# Patient Record
Sex: Female | Born: 1955 | Race: White | Hispanic: No | Marital: Married | Smoking: Current every day smoker
Health system: Southern US, Community
[De-identification: ages and names within clinical notes are randomized; demographics above are authoritative.]

## PROBLEM LIST (undated history)

## (undated) DIAGNOSIS — E785 Hyperlipidemia, unspecified: Secondary | ICD-10-CM

## (undated) DIAGNOSIS — E079 Disorder of thyroid, unspecified: Secondary | ICD-10-CM

## (undated) DIAGNOSIS — C55 Malignant neoplasm of uterus, part unspecified: Secondary | ICD-10-CM

## (undated) DIAGNOSIS — I10 Essential (primary) hypertension: Secondary | ICD-10-CM

## (undated) HISTORY — PX: ABDOMINAL HYSTERECTOMY: SHX81

## (undated) HISTORY — DX: Disorder of thyroid, unspecified: E07.9

## (undated) HISTORY — DX: Hyperlipidemia, unspecified: E78.5

## (undated) HISTORY — PX: BREAST BIOPSY: SHX20

## (undated) HISTORY — DX: Essential (primary) hypertension: I10

---

## 2005-01-13 ENCOUNTER — Ambulatory Visit: Payer: Self-pay | Admitting: Internal Medicine

## 2006-11-03 ENCOUNTER — Ambulatory Visit: Payer: Self-pay | Admitting: Internal Medicine

## 2007-11-27 DIAGNOSIS — C55 Malignant neoplasm of uterus, part unspecified: Secondary | ICD-10-CM

## 2007-11-27 HISTORY — DX: Malignant neoplasm of uterus, part unspecified: C55

## 2007-12-27 ENCOUNTER — Ambulatory Visit: Payer: Self-pay | Admitting: Gynecologic Oncology

## 2007-12-28 ENCOUNTER — Ambulatory Visit: Payer: Self-pay | Admitting: Obstetrics and Gynecology

## 2008-01-04 ENCOUNTER — Ambulatory Visit: Payer: Self-pay | Admitting: Obstetrics and Gynecology

## 2008-01-10 ENCOUNTER — Inpatient Hospital Stay: Payer: Self-pay | Admitting: Obstetrics and Gynecology

## 2008-02-14 ENCOUNTER — Ambulatory Visit: Payer: Self-pay | Admitting: Gynecologic Oncology

## 2008-04-11 ENCOUNTER — Ambulatory Visit: Payer: Self-pay | Admitting: Internal Medicine

## 2011-03-05 ENCOUNTER — Ambulatory Visit: Payer: Self-pay | Admitting: Internal Medicine

## 2012-03-08 ENCOUNTER — Ambulatory Visit: Payer: Self-pay | Admitting: Internal Medicine

## 2012-03-17 ENCOUNTER — Ambulatory Visit: Payer: Self-pay | Admitting: Internal Medicine

## 2012-04-26 HISTORY — PX: BREAST CYST ASPIRATION: SHX578

## 2012-10-11 ENCOUNTER — Encounter: Payer: Self-pay | Admitting: *Deleted

## 2012-10-27 ENCOUNTER — Other Ambulatory Visit: Payer: Federal, State, Local not specified - PPO

## 2012-10-27 ENCOUNTER — Encounter: Payer: Self-pay | Admitting: General Surgery

## 2012-10-27 ENCOUNTER — Ambulatory Visit (INDEPENDENT_AMBULATORY_CARE_PROVIDER_SITE_OTHER): Payer: Federal, State, Local not specified - PPO | Admitting: General Surgery

## 2012-10-27 VITALS — BP 124/68 | HR 74 | Resp 14 | Ht 61.0 in | Wt 119.0 lb

## 2012-10-27 DIAGNOSIS — N6001 Solitary cyst of right breast: Secondary | ICD-10-CM

## 2012-10-27 DIAGNOSIS — N63 Unspecified lump in unspecified breast: Secondary | ICD-10-CM

## 2012-10-27 DIAGNOSIS — N6009 Solitary cyst of unspecified breast: Secondary | ICD-10-CM

## 2012-10-27 NOTE — Patient Instructions (Signed)
Patient to return in February 2015 from bilateral screening mammogram.

## 2012-10-27 NOTE — Progress Notes (Addendum)
Patient ID: Yolanda Bailey, female   DOB: [birth year], 57 y.o.   MRN: [id number]  Chief Complaint  Patient presents with  . Other    mammogram    HPI Yolanda Bailey is a 57 y.o. female who presents for a breast evaluation. The most recent mammogram was done on 03/17/12. Patient does perform regular self breast checks and gets regular mammograms done.    HPI  Past Medical History  Diagnosis Date  . Hyperlipidemia   . Hypertension   . Thyroid disorder     Past Surgical History  Procedure Laterality Date  . Abdominal hysterectomy    . Breast biopsy Left     No family history on file.  Social History History  Substance Use Topics  . Smoking status: Current Every Day Smoker -- 1.00 packs/day for 40 years  . Smokeless tobacco: Never Used  . Alcohol Use: Yes    Allergies  Allergen Reactions  . Synthroid [Levothyroxine]     Liver levels    Current Outpatient Prescriptions  Medication Sig Dispense Refill  . aspirin 81 MG tablet Take 81 mg by mouth daily.      . calcium carbonate (OS-CAL) 600 MG TABS tablet Take 600 mg by mouth 2 (two) times daily with a meal.      . chlorhexidine (PERIDEX) 0.12 % solution 1 mL.      . hydrochlorothiazide (HYDRODIURIL) 25 MG tablet Take 25 mg by mouth daily.      Marland Kitchen levothyroxine (SYNTHROID, LEVOTHROID) 100 MCG tablet Take 100 mcg by mouth daily before breakfast.      . simvastatin (ZOCOR) 20 MG tablet Take 20 mg by mouth every evening.      . Vitamin D, Ergocalciferol, (DRISDOL) 50000 UNITS CAPS capsule Take 50,000 Units by mouth.       No current facility-administered medications for this visit.    Review of Systems Review of Systems  Constitutional: Negative.   Respiratory: Negative.   Cardiovascular: Negative.     Blood pressure 124/68, pulse 74, resp. rate 14, height 5\' 1"  (1.549 m), weight 119 lb (53.978 kg).  Physical Exam Physical Exam  Constitutional: She is oriented to person, place, and time. She appears well-developed and  well-nourished.  Cardiovascular: Normal rate, regular rhythm and normal heart sounds.   Pulmonary/Chest: Breath sounds normal. Right breast exhibits no inverted nipple, no mass, no nipple discharge, no skin change and no tenderness. Left breast exhibits no inverted nipple, no mass, no nipple discharge, no skin change and no tenderness.  Abdominal: Bowel sounds are normal.  Lymphadenopathy:    She has no cervical adenopathy.    She has no axillary adenopathy.  Neurological: She is alert and oriented to person, place, and time.  Skin: Skin is warm and dry.    Data Reviewed March 17, 2012 mammograms that showed an indeterminate nodule of the right breast. BI-RAD-4. Ultrasound examination dated March 28, 2012 and showed a well-defined 0.3 x 0.54 x 0.64 cm nodule in the right breast one centimeter from the nipple. Aspiration was completed with near complete resolution. Cytology was benign.  Ultrasound examination of the right breast in the 6:00 position again shows a 0.36 x 0.64 x 0.65 cm anechoic lesion with posterior acoustic enhancement. It is difficult to ascertain whether there is a thick wall or whether prominent breast parenchyma superior to this lesion. The patient was reluctantly  amenable to repeat aspiration. This was completed using 1 cc of 1% plain Xylocaine. No discomfort during puncture of the  skin but tenderness in passage through the thickened tissue superior to the cyst. Aspiration showed complete resolution. This fluid was discarded.  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 Previous benign cytology.     Plan    We'll arrange for a followup exam with repeat screening mammograms in 6 months with office ultrasound follow.        Yolanda Bailey 11/28/2012, 12:19 PM

## 2012-10-27 NOTE — Addendum Note (Signed)
Addended by: [name] on: 10/27/2012 09:50 PM   Modules accepted: Orders

## 2012-11-09 ENCOUNTER — Encounter: Payer: Self-pay | Admitting: General Surgery

## 2012-11-23 ENCOUNTER — Emergency Department: Payer: Self-pay | Admitting: Internal Medicine

## 2012-11-23 LAB — CBC
HCT: 42.8 % (ref 35.0–47.0)
HGB: 14.8 g/dL (ref 12.0–16.0)
MCV: 90 fL (ref 80–100)
Platelet: 280 10*3/uL (ref 150–440)
RDW: 13.2 % (ref 11.5–14.5)
WBC: 6.2 10*3/uL (ref 3.6–11.0)

## 2012-11-23 LAB — BASIC METABOLIC PANEL
Anion Gap: 6 — ABNORMAL LOW (ref 7–16)
Chloride: 101 mmol/L (ref 98–107)
Co2: 26 mmol/L (ref 21–32)
Creatinine: 0.8 mg/dL (ref 0.60–1.30)
EGFR (Non-African Amer.): >60
Potassium: 3.5 mmol/L (ref 3.5–5.1)

## 2012-11-23 LAB — TROPONIN I: Troponin-I: <0.02 ng/mL

## 2012-12-02 ENCOUNTER — Ambulatory Visit: Payer: Self-pay | Admitting: Internal Medicine

## 2013-03-16 ENCOUNTER — Ambulatory Visit: Payer: Self-pay | Admitting: General Surgery

## 2013-03-16 ENCOUNTER — Encounter: Payer: Self-pay | Admitting: General Surgery

## 2013-03-22 ENCOUNTER — Telehealth: Payer: Self-pay

## 2013-03-22 NOTE — Telephone Encounter (Signed)
Message left for patient to call back to reschedule her appointment for tomorrow due to the weather.

## 2013-03-22 NOTE — Telephone Encounter (Signed)
Patient rescheduled for 04/11/13 at 1:30 pm.

## 2013-03-23 ENCOUNTER — Ambulatory Visit: Payer: Federal, State, Local not specified - PPO | Admitting: General Surgery

## 2013-04-11 ENCOUNTER — Ambulatory Visit (INDEPENDENT_AMBULATORY_CARE_PROVIDER_SITE_OTHER): Payer: Federal, State, Local not specified - PPO | Admitting: General Surgery

## 2013-04-11 ENCOUNTER — Encounter: Payer: Self-pay | Admitting: General Surgery

## 2013-04-11 VITALS — BP 102/56 | HR 70 | Resp 12 | Ht 61.0 in | Wt 123.0 lb

## 2013-04-11 DIAGNOSIS — N6009 Solitary cyst of unspecified breast: Secondary | ICD-10-CM

## 2013-04-11 NOTE — Patient Instructions (Signed)
Continue self breast exams. Call office for any new breast issues or concerns. 

## 2013-04-11 NOTE — Progress Notes (Signed)
Patient ID: Yolanda Bailey, female   DOB: 04/18/1955, 58 y.o.   MRN: [id number]  Chief Complaint  Patient presents with  . Follow-up    follow up bilateral screening mammogram     HPI Yolanda Bailey is a 58 y.o. female who presents for a breast evaluation. The most recent mammogram was done on 03/16/13. Patient does perform regular self breast checks and gets regular mammograms done. The patient denies any problems with the breasts at this time.    HPI  Past Medical History  Diagnosis Date  . Hyperlipidemia   . Hypertension   . Thyroid disorder     Past Surgical History  Procedure Laterality Date  . Abdominal hysterectomy    . Breast biopsy Left     History reviewed. No pertinent family history.  Social History History  Substance Use Topics  . Smoking status: Current Every Day Smoker -- 1.00 packs/day for 40 years  . Smokeless tobacco: Never Used  . Alcohol Use: Yes    Allergies  Allergen Reactions  . Synthroid [Levothyroxine]     Liver levels    Current Outpatient Prescriptions  Medication Sig Dispense Refill  . ALPRAZolam (XANAX) 0.25 MG tablet       . aspirin 81 MG tablet Take 81 mg by mouth daily.      . calcium carbonate (OS-CAL) 600 MG TABS tablet Take 600 mg by mouth 2 (two) times daily with a meal.      . chlorhexidine (PERIDEX) 0.12 % solution 1 mL.      . hydrochlorothiazide (HYDRODIURIL) 25 MG tablet Take 25 mg by mouth daily.      Marland Kitchen levothyroxine (SYNTHROID) 100 MCG tablet Take 100 mcg by mouth daily before breakfast.      . sertraline (ZOLOFT) 50 MG tablet       . simvastatin (ZOCOR) 20 MG tablet Take 20 mg by mouth every evening.      . Vitamin D, Ergocalciferol, (DRISDOL) 50000 UNITS CAPS capsule Take 50,000 Units by mouth.       No current facility-administered medications for this visit.    Review of Systems Review of Systems  Constitutional: Negative.   Respiratory: Negative.   Cardiovascular: Negative.     Blood pressure 102/56, pulse 70,  resp. rate 12, height 5\' 1"  (1.549 m), weight 123 lb (55.792 kg).  Physical Exam Physical Exam  Constitutional: She is oriented to person, place, and time. She appears well-developed and well-nourished.  Neck: Neck supple.  Cardiovascular: Normal rate, regular rhythm and normal heart sounds.   Pulmonary/Chest: Effort normal and breath sounds normal. Right breast exhibits no inverted nipple, no mass, no nipple discharge, no skin change and no tenderness. Left breast exhibits no inverted nipple, no mass, no nipple discharge, no skin change and no tenderness.  Lymphadenopathy:    She has no cervical adenopathy.    She has no axillary adenopathy.  Neurological: She is alert and oriented to person, place, and time.  Skin: Skin is warm and dry.    Data Reviewed Bilateral mammograms dated March 16, 2013 were reviewed and compared to previous studies. Density located in the 6 o'clock position of the right breast is no longer evident.  Radiologist recommendation follow up in one year. BI-RAD-1.  Assessment    Benign breast exam.     Plan    No evidence of recurrent mammographic evidence of cyst recurrence. No indication for repeat ultrasound on today's exam.  The patient will resume annual screening mammograms in the  care of Dr. Doy Hutching in spring 2016.        Yolanda Bailey 04/11/2013, 9:51 PM

## 2013-06-16 ENCOUNTER — Ambulatory Visit: Payer: Self-pay | Admitting: Gastroenterology

## 2013-11-27 ENCOUNTER — Encounter: Payer: Self-pay | Admitting: General Surgery

## 2014-03-28 IMAGING — US ULTRASOUND RIGHT BREAST
1 series · 14 of 25 positions shown · non-contrast
Comparison: none

REASON FOR EXAM: av rt asymmetric density
COMMENTS:

PROCEDURE:     US  - US BREAST RIGHT  - March 17, 2012 [DATE]
RESULT:

[Series 1: ultrasound right breast · 0.08mm/px · 14 of 28 slices shown]
[im 1/28]
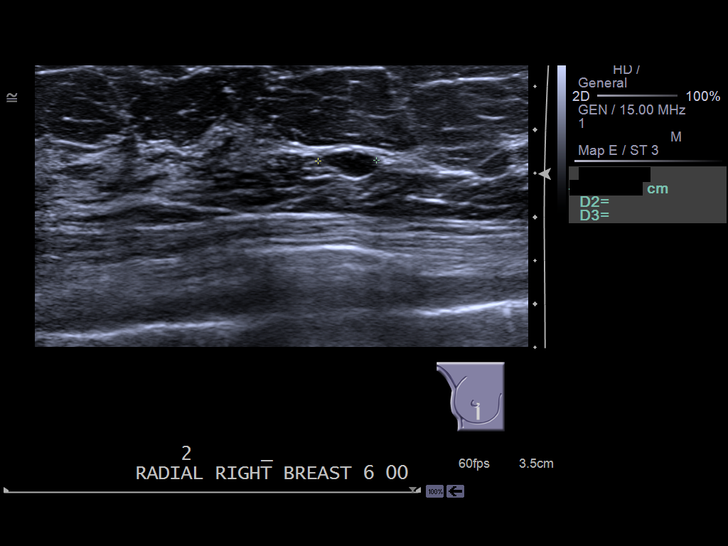
[im 3/28]
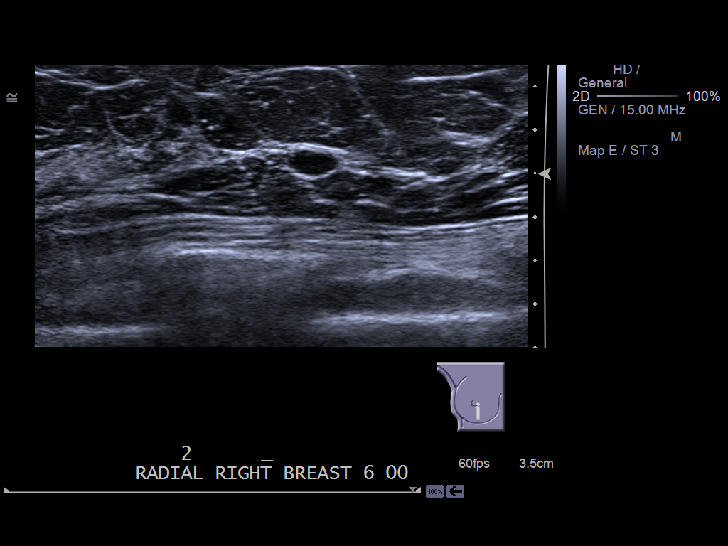
[im 5/28]
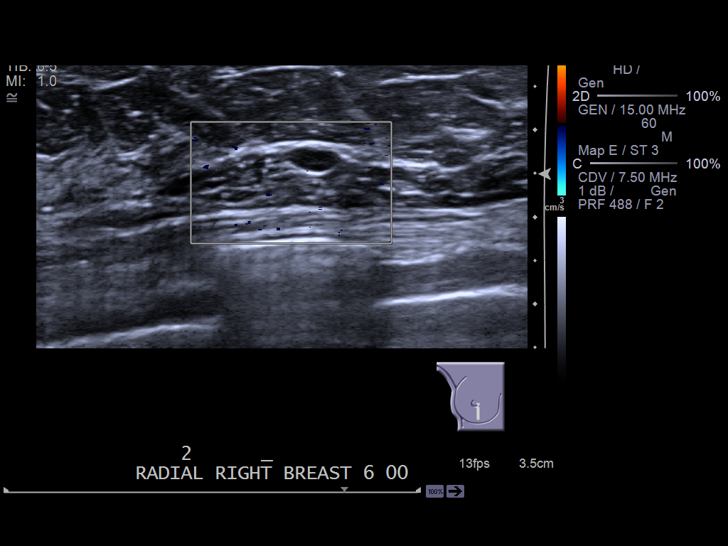
[im 7/28]
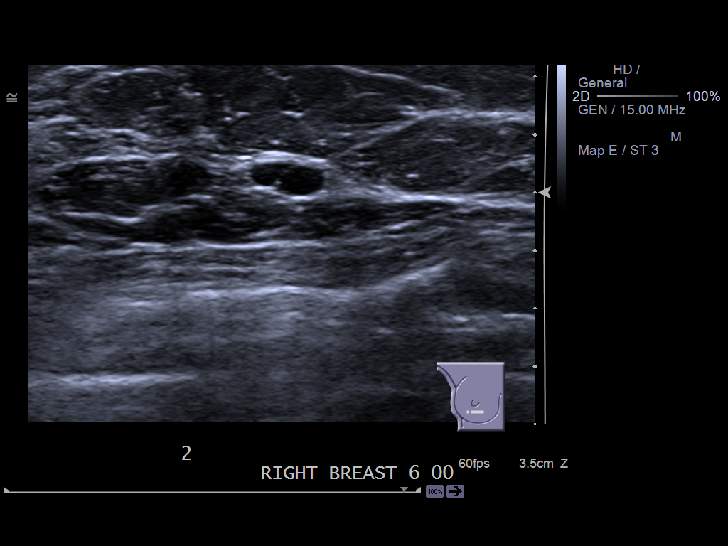
[im 10/28]
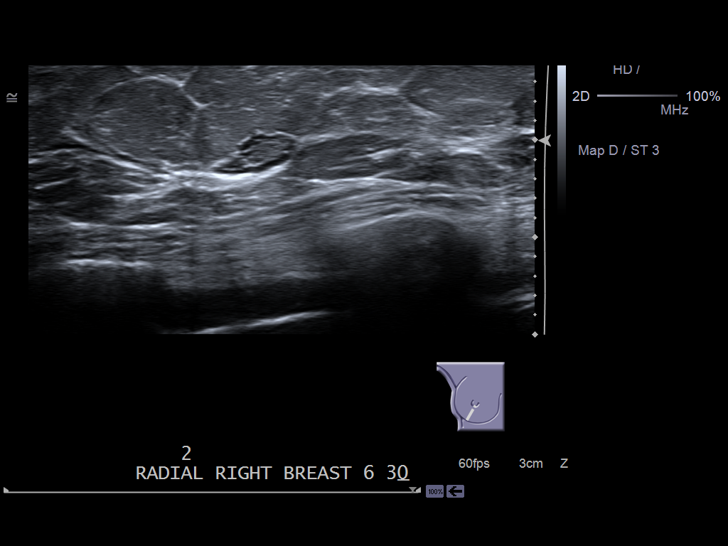
[im 11/28]
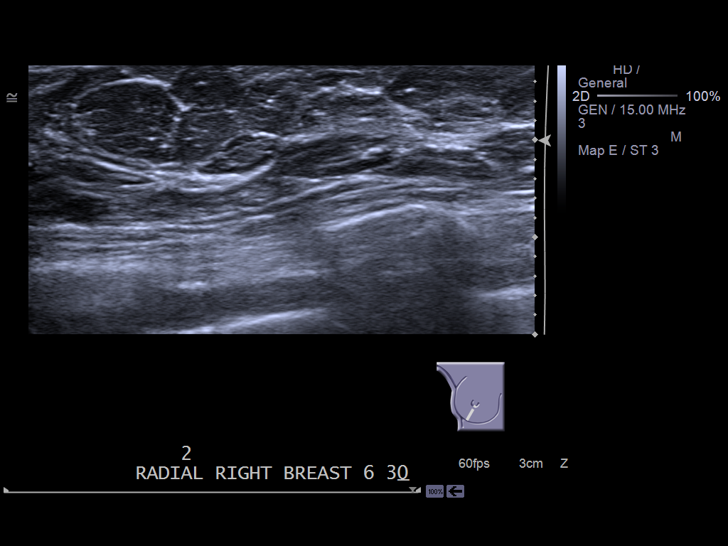
[im 13/28]
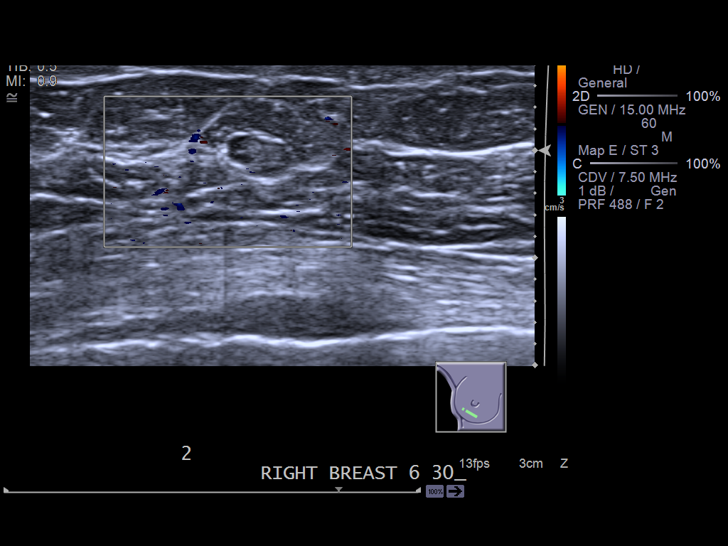
[im 15/28]
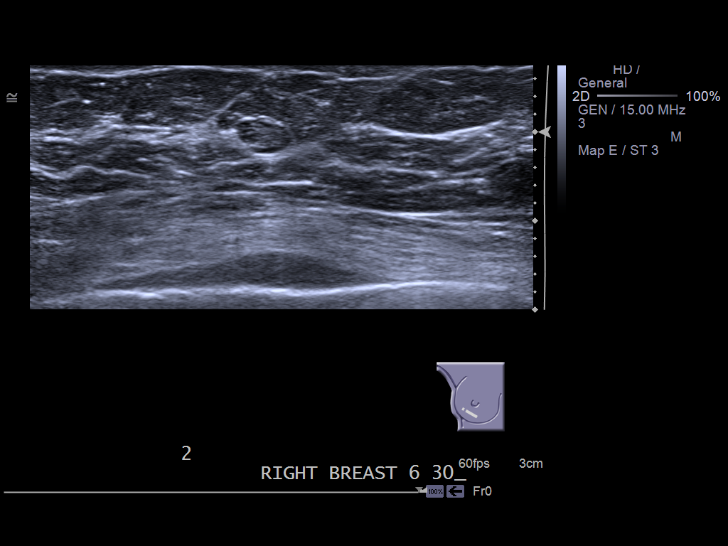
[im 17/28]
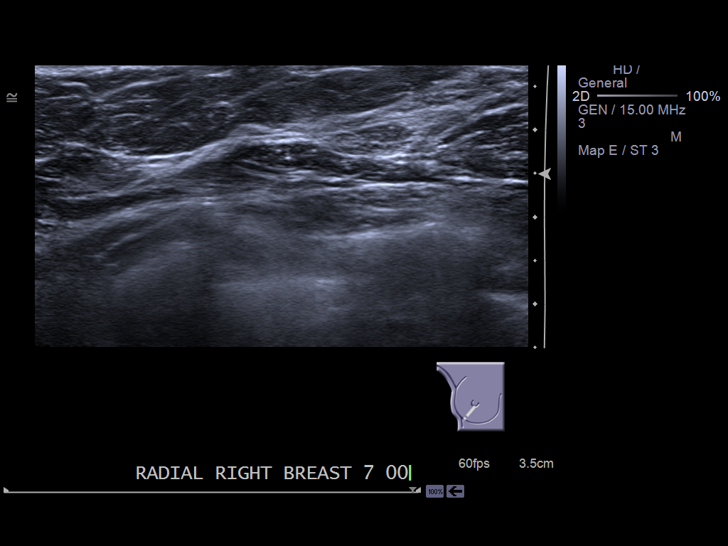
[im 19/28]
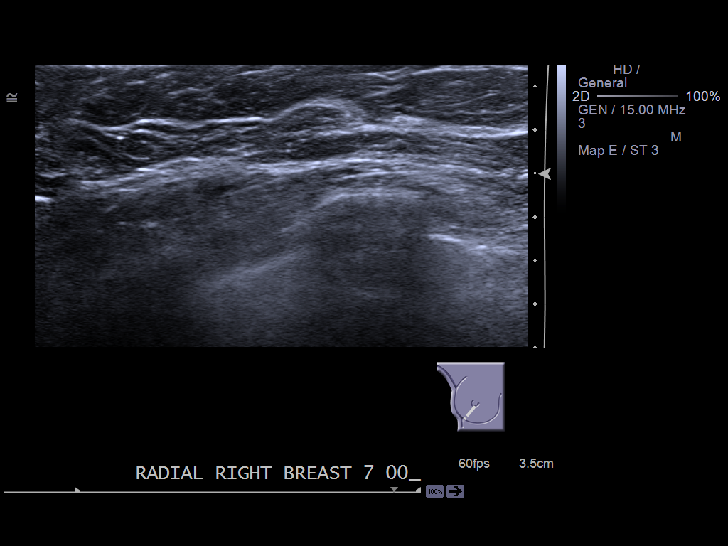
[im 21/28]
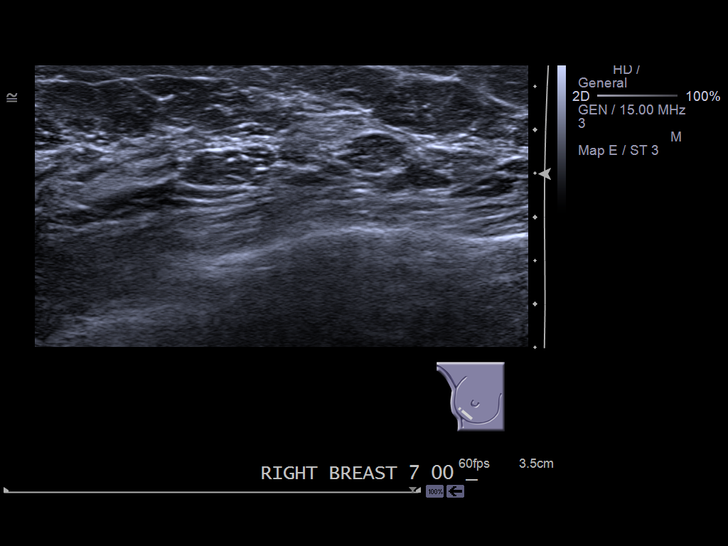
[im 23/28]
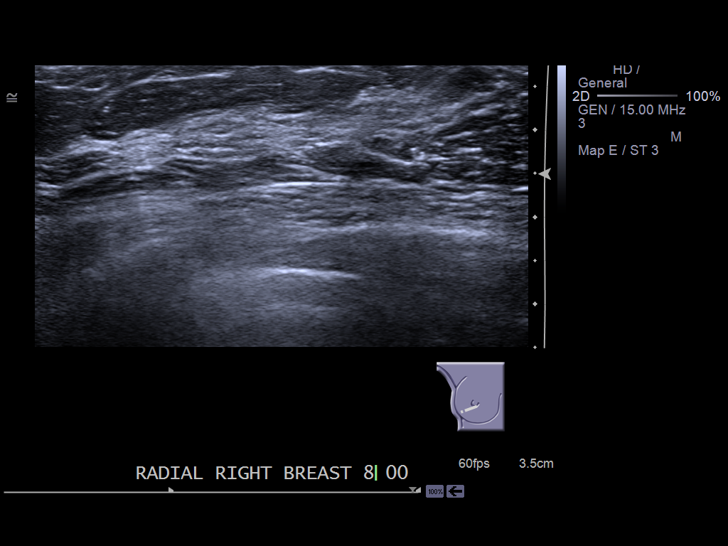
[im 25/28]
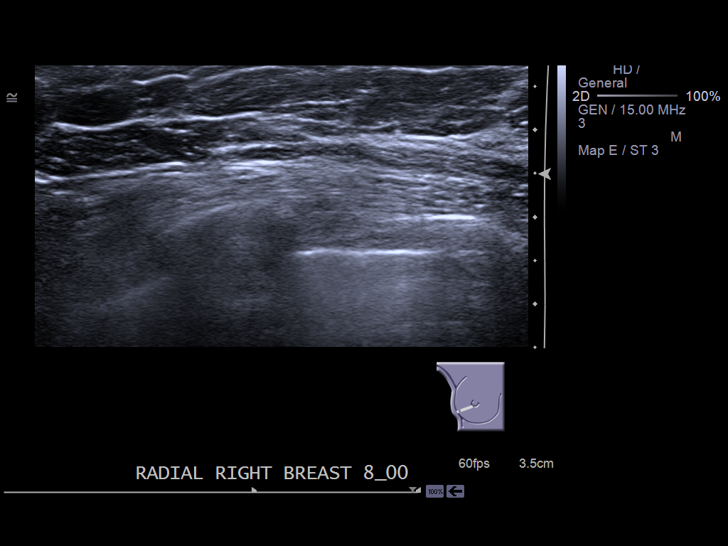
[im 28/28]
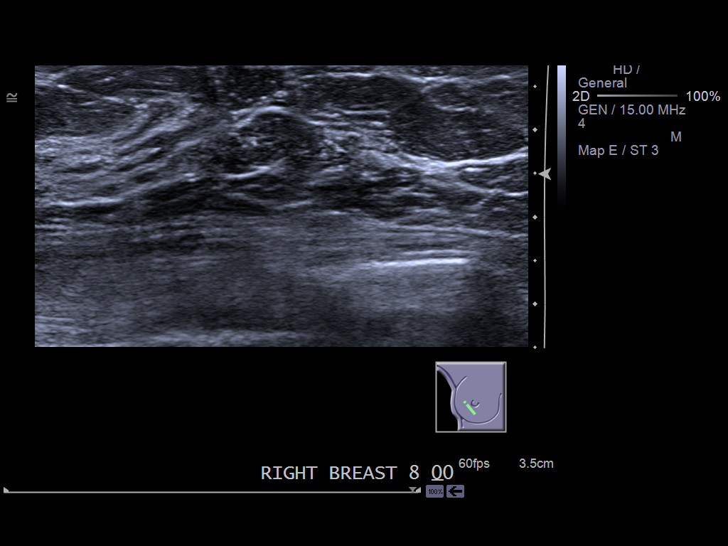

[14 of 25 positions shown; findings below may reference images not displayed]

FINDINGS: Evaluation of the right breast in the region of interest
demonstrates a hypoechoic nodule measuring 0.3 x 0.67 x 0.65 cm at the [DATE]
position, approximately 2 cm from the nipple. A linear area traverses the
nodule and there is a component of increased through transmission suggesting
a septated cyst. The nodule is not anechoic and thus not pathognomonic for a
cyst. At the [DATE] position, a second nodule is appreciated, approximately 2
cm from the nipple measuring 0.88 x 0.43 x 0.58 cm. This nodule demonstrates
a hypoechoic rim with a central area of increased echogenicity. The
hypoechoic rim only partially involves the nodule and the finding suggests
an etiology such as a benign lymph node. A non-nodal nodule cannot be
excluded. No further sonographic findings are identified.
IMPRESSION: Indeterminate, possibly benign nodules at the [DATE] and [DATE]
positions. Please refer to the bilateral additional radiographic view
dictation for completed discussion.

## 2014-05-22 ENCOUNTER — Other Ambulatory Visit: Payer: Self-pay | Admitting: Internal Medicine

## 2014-05-22 DIAGNOSIS — Z1231 Encounter for screening mammogram for malignant neoplasm of breast: Secondary | ICD-10-CM

## 2014-06-05 ENCOUNTER — Ambulatory Visit
Admission: RE | Admit: 2014-06-05 | Discharge: 2014-06-05 | Disposition: A | Discharge status: Home / Self Care | Payer: Federal, State, Local not specified - PPO | Source: Ambulatory Visit | Attending: Internal Medicine | Admitting: Internal Medicine

## 2014-06-05 DIAGNOSIS — Z1231 Encounter for screening mammogram for malignant neoplasm of breast: Secondary | ICD-10-CM | POA: Diagnosis not present

## 2014-06-05 HISTORY — DX: Malignant neoplasm of uterus, part unspecified: C55

## 2015-03-27 IMAGING — MG MM DIGITAL SCREENING BILAT W/ CAD
1 series · 4 of 4 positions shown · non-contrast
Comparison: Previous exam(s).

CLINICAL DATA: Screening.

EXAM:
DIGITAL SCREENING BILATERAL MAMMOGRAM WITH CAD

[R CC · right · 4 of 4 slices shown]
[im 1/4]
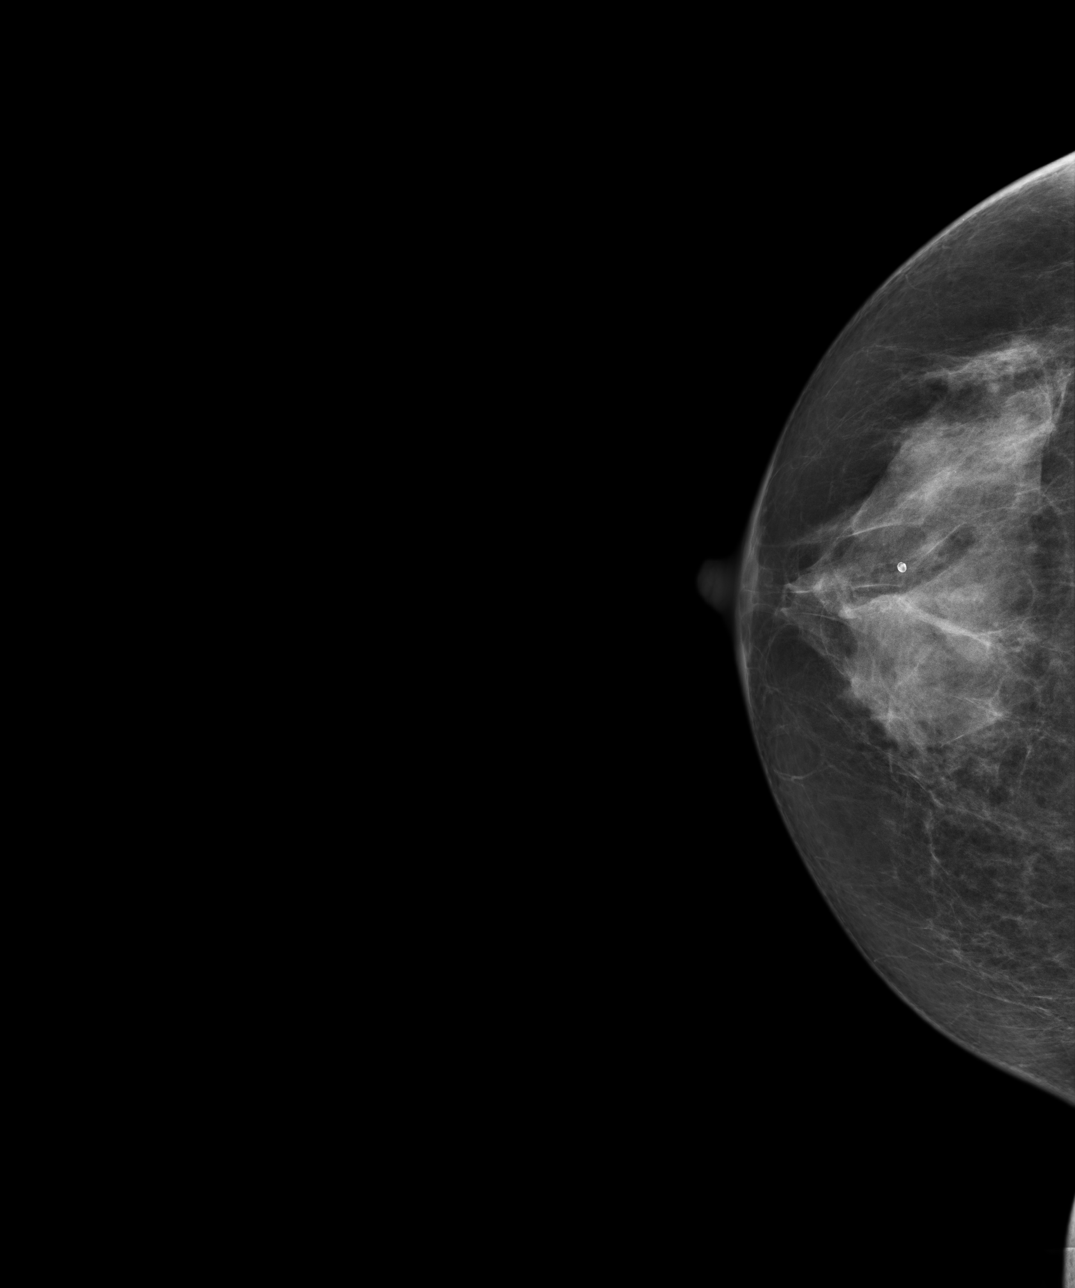
[im 2/4]
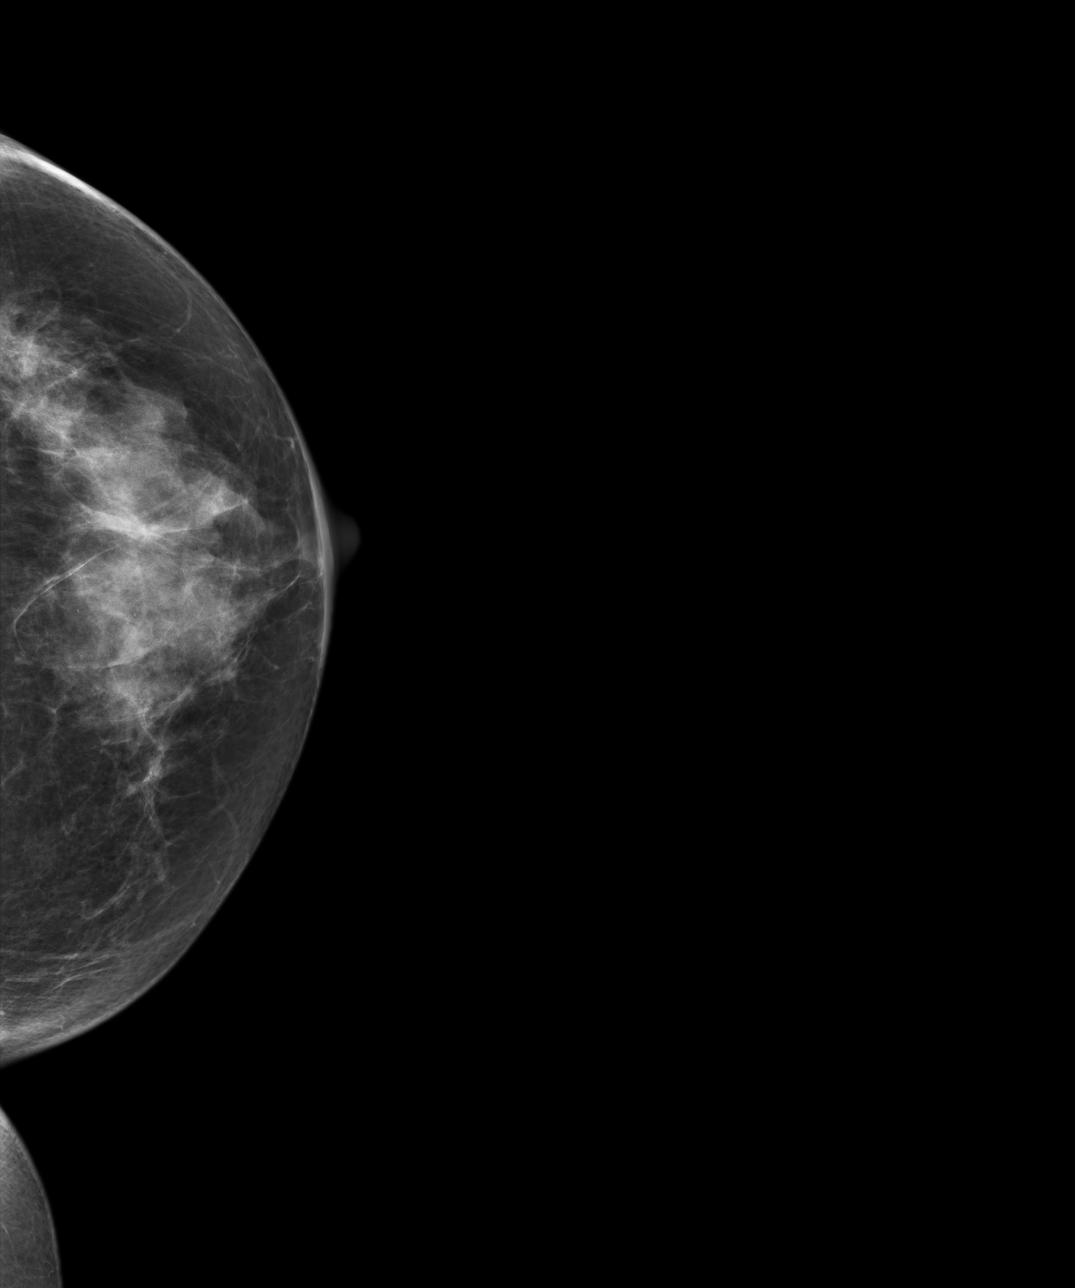
[im 3/4]
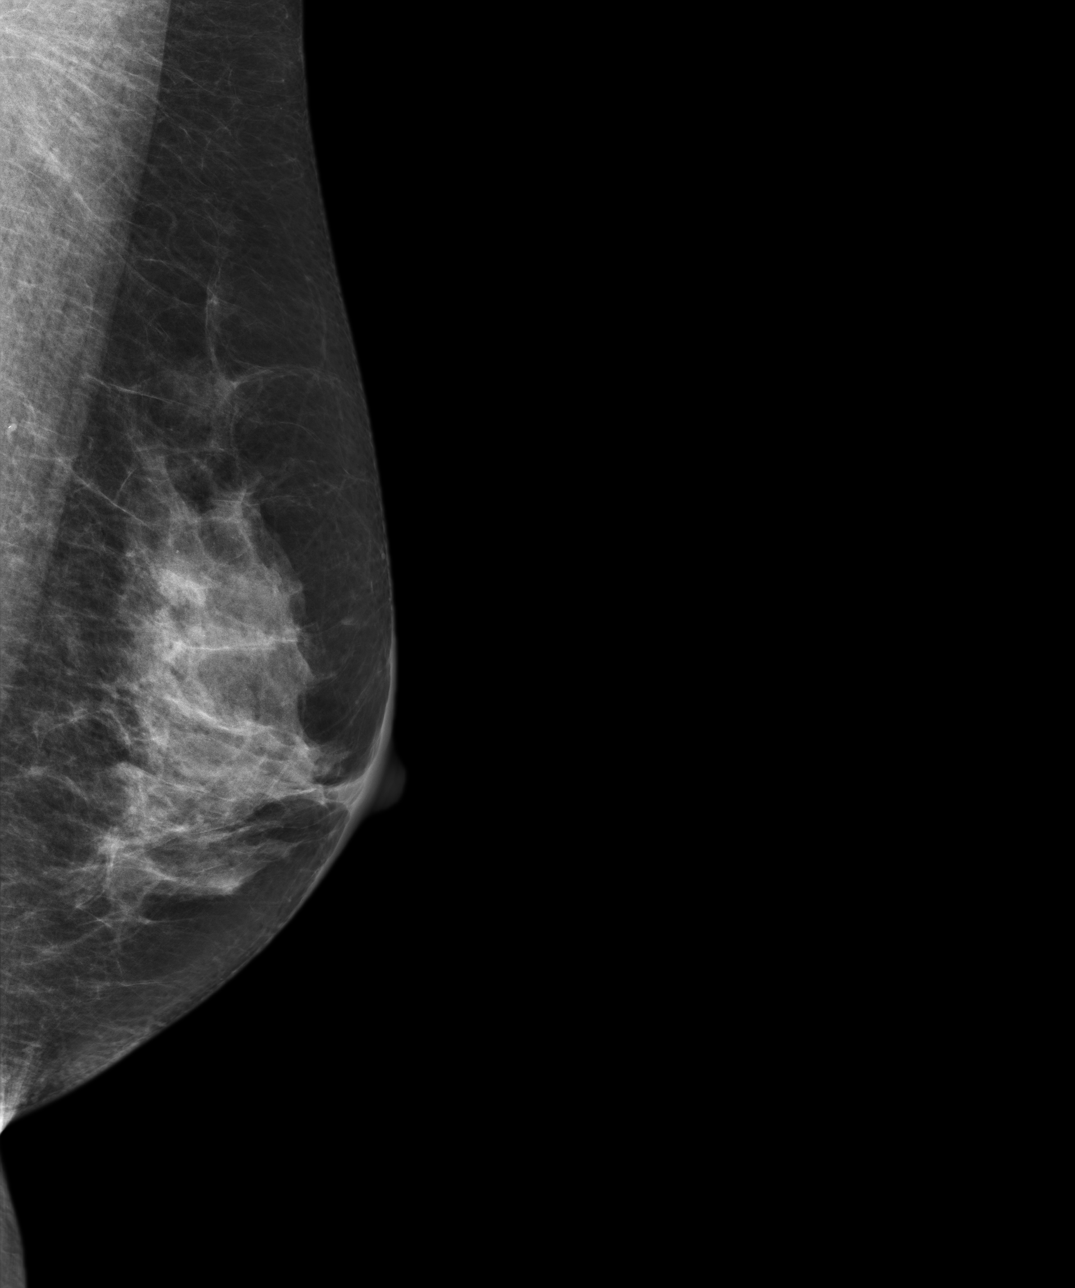
[im 4/4]
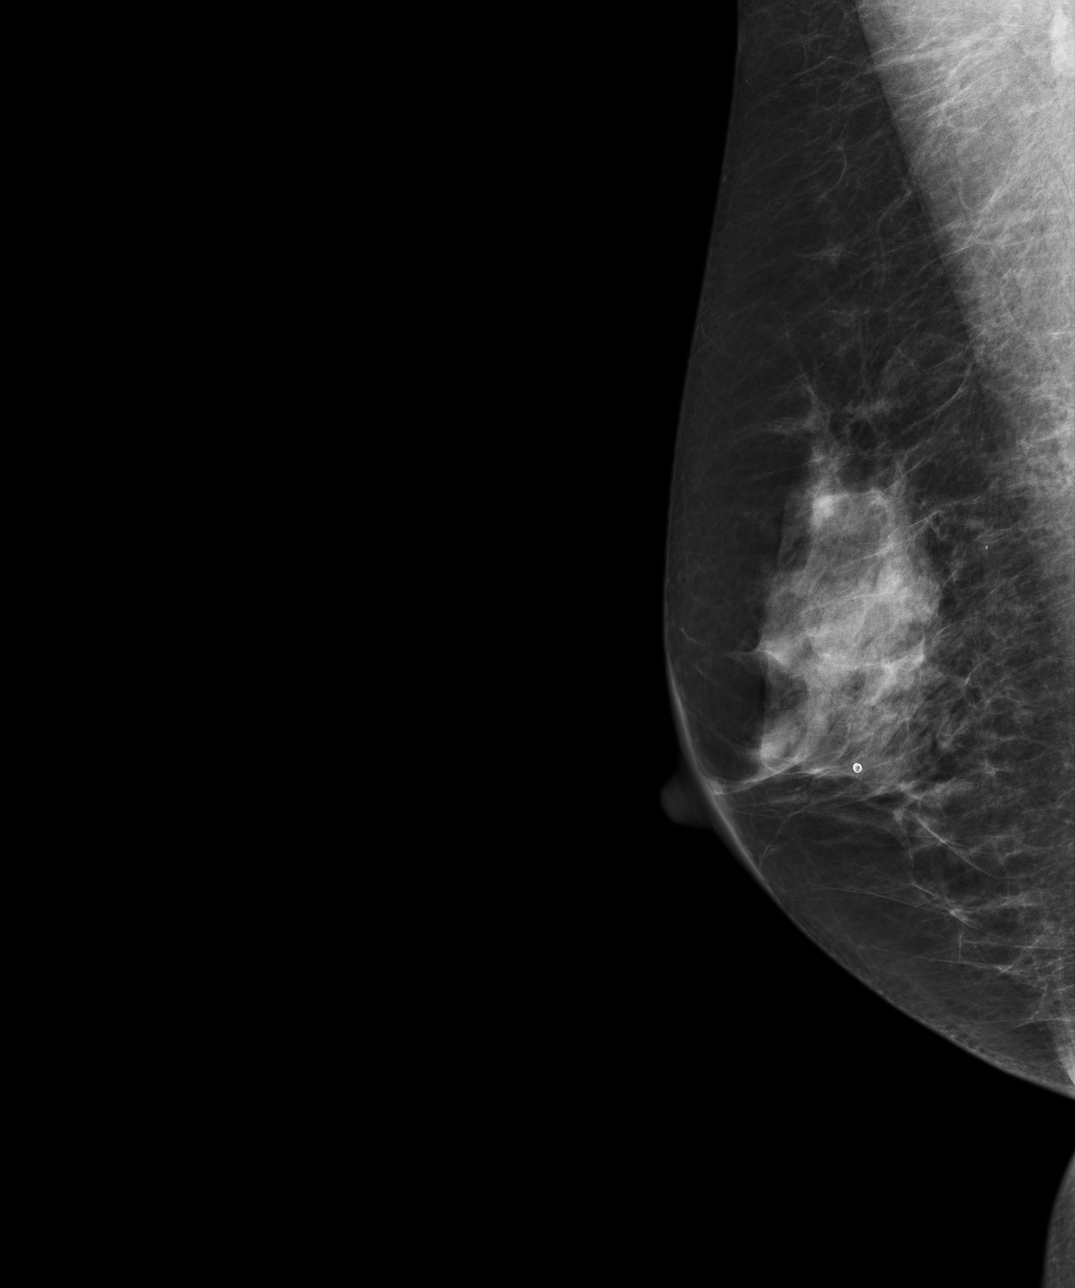

[4 of 4 positions shown; findings below may reference images not displayed]

ACR Breast Density Category d: The breast tissue is extremely dense,
which lowers the sensitivity of mammography.
FINDINGS: There are no findings suspicious for malignancy. Images were
processed with CAD.
IMPRESSION: No mammographic evidence of malignancy. A result letter of this
screening mammogram will be mailed directly to the patient.

RECOMMENDATION:
Screening mammogram in one year. (Code:[id number])

BI-RADS CATEGORY  1: Negative.

## 2015-06-27 ENCOUNTER — Other Ambulatory Visit: Payer: Self-pay | Admitting: Internal Medicine

## 2015-06-27 DIAGNOSIS — Z1231 Encounter for screening mammogram for malignant neoplasm of breast: Secondary | ICD-10-CM

## 2015-07-25 ENCOUNTER — Other Ambulatory Visit: Payer: Self-pay | Admitting: Internal Medicine

## 2015-07-25 ENCOUNTER — Ambulatory Visit
Admission: RE | Admit: 2015-07-25 | Discharge: 2015-07-25 | Disposition: A | Discharge status: Home / Self Care | Payer: Federal, State, Local not specified - PPO | Source: Ambulatory Visit | Attending: Internal Medicine | Admitting: Internal Medicine

## 2015-07-25 DIAGNOSIS — Z1231 Encounter for screening mammogram for malignant neoplasm of breast: Secondary | ICD-10-CM

## 2015-08-09 ENCOUNTER — Other Ambulatory Visit: Payer: Self-pay | Admitting: Internal Medicine

## 2015-08-09 DIAGNOSIS — N644 Mastodynia: Secondary | ICD-10-CM

## 2015-08-09 DIAGNOSIS — Z1239 Encounter for other screening for malignant neoplasm of breast: Secondary | ICD-10-CM

## 2015-08-20 ENCOUNTER — Ambulatory Visit
Admission: RE | Admit: 2015-08-20 | Discharge: 2015-08-20 | Disposition: A | Discharge status: Home / Self Care | Payer: Federal, State, Local not specified - PPO | Source: Ambulatory Visit | Attending: Internal Medicine | Admitting: Internal Medicine

## 2015-08-20 DIAGNOSIS — N644 Mastodynia: Secondary | ICD-10-CM | POA: Insufficient documentation

## 2015-08-20 DIAGNOSIS — Z1239 Encounter for other screening for malignant neoplasm of breast: Secondary | ICD-10-CM | POA: Diagnosis not present

## 2016-06-11 ENCOUNTER — Other Ambulatory Visit: Payer: Self-pay | Admitting: Internal Medicine

## 2016-06-11 DIAGNOSIS — Z1231 Encounter for screening mammogram for malignant neoplasm of breast: Secondary | ICD-10-CM

## 2016-08-20 ENCOUNTER — Ambulatory Visit
Admission: RE | Admit: 2016-08-20 | Discharge: 2016-08-20 | Disposition: A | Discharge status: Home / Self Care | Payer: Federal, State, Local not specified - PPO | Source: Ambulatory Visit | Attending: Internal Medicine | Admitting: Internal Medicine

## 2016-08-20 DIAGNOSIS — Z1231 Encounter for screening mammogram for malignant neoplasm of breast: Secondary | ICD-10-CM | POA: Diagnosis not present

## 2016-11-13 ENCOUNTER — Other Ambulatory Visit: Payer: Self-pay | Admitting: Internal Medicine

## 2016-11-13 DIAGNOSIS — R9389 Abnormal findings on diagnostic imaging of other specified body structures: Secondary | ICD-10-CM

## 2016-11-19 ENCOUNTER — Ambulatory Visit
Admission: RE | Admit: 2016-11-19 | Discharge: 2016-11-19 | Disposition: A | Discharge status: Home / Self Care | Payer: Federal, State, Local not specified - PPO | Source: Ambulatory Visit | Attending: Internal Medicine | Admitting: Internal Medicine

## 2016-11-19 DIAGNOSIS — R9389 Abnormal findings on diagnostic imaging of other specified body structures: Secondary | ICD-10-CM | POA: Diagnosis present

## 2016-11-19 DIAGNOSIS — I700 Atherosclerosis of aorta: Secondary | ICD-10-CM | POA: Insufficient documentation

## 2016-11-19 DIAGNOSIS — J439 Emphysema, unspecified: Secondary | ICD-10-CM | POA: Diagnosis not present

## 2016-12-02 ENCOUNTER — Encounter: Payer: Self-pay | Admitting: Pulmonary Disease

## 2016-12-02 ENCOUNTER — Ambulatory Visit: Payer: Federal, State, Local not specified - PPO | Admitting: Pulmonary Disease

## 2016-12-02 VITALS — BP 128/86 | HR 78 | Resp 16 | Ht 61.0 in | Wt 142.0 lb

## 2016-12-02 DIAGNOSIS — J410 Simple chronic bronchitis: Secondary | ICD-10-CM

## 2016-12-02 DIAGNOSIS — R0609 Other forms of dyspnea: Secondary | ICD-10-CM | POA: Diagnosis not present

## 2016-12-02 DIAGNOSIS — F17200 Nicotine dependence, unspecified, uncomplicated: Secondary | ICD-10-CM

## 2016-12-02 DIAGNOSIS — R9389 Abnormal findings on diagnostic imaging of other specified body structures: Secondary | ICD-10-CM | POA: Diagnosis not present

## 2016-12-02 MED ORDER — BUPROPION HCL ER (SR) 100 MG PO TB12: 100.0000 mg | ORAL_TABLET | Freq: Two times a day (BID) | ORAL | 2 refills | Status: DC

## 2016-12-02 NOTE — Patient Instructions (Addendum)
Smoking cessation as we discussed  Bupropion (AKA Zyban or Wellbutrin) 100 mg once a day for the first week, then twice a day  Trial of Breo inhaler - sample provided - one inhalation daily. Rinse mouth after use. Note whether it helps your cough and/or shortness of breath  Follow up in 4-6 weeks with PFTs (lung function tests) prior to that visit

## 2016-12-03 NOTE — Progress Notes (Signed)
PULMONARY CONSULT NOTE  Requesting MD/Service: Doy Hutching Date of initial consultation: 12/02/16 Reason for consultation: Abnormal CT scan chest  PT PROFILE: 61 y.o. female smoker referred for evaluation of shortness of breath and abnormality noted on CT chest  DATA: 11/19/16 CT chest: Emphysema.  Bronchial wall thickening, greatest in the RLL with mild mucous plugging  HPI:  As above.  She underwent "routine" chest x-ray (images not available to me) and this prompted a CT scan of the chest with the findings as above which prompted referral to me.  She reports gradually progressive dyspnea over years.  It is still relatively mild and she is able to do all activities of daily living.  She has cough on most days productive of clear to brown mucus.  She is never had hemoptysis.  She denies pleuritic chest pain.  She denies orthopnea and paroxysmal nocturnal dyspnea.  She has no lower extremity edema or calf tenderness.  She does report generalized fatigue.  With regard to her smoking, she has smoked almost her entire adult life (however, able to quit during pregnancies).  Presently she smokes 1 pack of cigarettes per day.  The past she has used Chantix on 2 occasions.  On the first occasion it was beneficial and she was able to abstain from cigarettes for some time after that.  However, on the second occasion it made her quite ill and she was unable to continue with that medication.  She has been diagnosed with bronchitis in the past.  She has been prescribed albuterol past which she believes was beneficial for her symptoms at that time.  She has no significant occupational exposures.  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  Past Medical History:  Diagnosis Date  . Hyperlipidemia   . Hypertension   . Thyroid disorder   . Uterus cancer ([place]) 11/27/2007  Depression -on Zoloft  Past Surgical History:  Procedure Laterality Date  .  ABDOMINAL HYSTERECTOMY    . BREAST BIOPSY Left   . BREAST CYST ASPIRATION Bilateral 04/26/2012    MEDICATIONS: I have reviewed all medications and confirmed regimen as documented  Social History   Socioeconomic History  . Marital status: Married    Spouse name: Not on file  . Number of children: Not on file  . Years of education: Not on file  . Highest education level: Not on file  Social Needs  . Financial resource strain: Not on file  . Food insecurity - worry: Not on file  . Food insecurity - inability: Not on file  . Transportation needs - medical: Not on file  . Transportation needs - non-medical: Not on file  Occupational History  . Not on file  Tobacco Use  . Smoking status: Current Every Day Smoker    Packs/day: 1.00    Years: 40.00    Pack years: 40.00  . Smokeless tobacco: Never Used  Substance and Sexual Activity  . Alcohol use: Yes  . Drug use: No  . Sexual activity: Not on file  Other Topics Concern  . Not on file  Social History Narrative  . Not on file    Family History  Problem Relation Age of Onset  . Breast cancer Neg Hx     ROS: No fever, myalgias/arthralgias, unexplained weight loss or weight gain No new focal weakness or sensory deficits No otalgia, hearing loss, visual changes, nasal and sinus symptoms, mouth and throat problems No neck pain  or adenopathy No abdominal pain, N/V/D, diarrhea, change in bowel pattern No dysuria, change in urinary pattern   Vitals:   12/02/16 0830 12/02/16 0831  BP:  128/86  Pulse:  78  Resp: 16   SpO2:  97%  Weight: 64.4 kg (142 lb)   Height: 5\' 1"  (1.549 m)      EXAM:   Gen: WDWN in NAD HEENT: NCAT, sclerae white, oropharynx normal Neck: NO LAN, no JVD noted Lungs: full BS, normal percussion note throughout, no adventitious sounds Cardiovascular: Reg rate, normal rhythm, no M noted Abdomen: Soft, NT, +BS Ext: no C/C/E Neuro: PERRL, EOMI, motor/sensory grossly intact Skin: No lesions noted    DATA:   BMP Latest Ref Rng & Units 11/23/2012  Glucose 65 - 99 mg/dL 101(H)  BUN 7 - 18 mg/dL 11  Creatinine 0.60 - 1.30 mg/dL 0.80  Sodium 136 - 145 mmol/L 133(L)  Potassium 3.5 - 5.1 mmol/L 3.5  Chloride 98 - 107 mmol/L 101  CO2 21 - 32 mmol/L 26  Calcium 8.5 - 10.1 mg/dL 9.4    CBC Latest Ref Rng & Units 11/23/2012  WBC 3.6 - 11.0 x10 3/mm 3 6.2  Hemoglobin 12.0 - 16.0 g/dL 14.8  Hematocrit 35.0 - 47.0 % 42.8  Platelets 150 - 440 x10 3/mm 3 280    CXR: N/A  IMPRESSION:     ICD-10-CM   1. Smoker F17.200 Pulmonary Function Test [facility] Only  2. 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 DOE (dyspnea on exertion) R06.09   4. Abnormal CT of the chest R93.89    The findings on CT of the chest not terribly worrisome bronchoscopic evaluation or further follow-up.  PLAN:  We discussed smoking cessation in detail.  We discussed strategies that she might undertake and medical therapies that might be beneficial.  I emphasized that my concern is the "vehicle" which she has chosen to deliver nicotine to her brain.  We discussed the possibility of using nicotine replacement therapy.  Chantix is obviously not an option given her unpleasant experience with that in the past.  We discussed the possibility of bupropion (Zyban) which she wishes to try.  Bupropion 100 mg daily for 1 week, then twice daily  Trial of Breo inhaler -1 inhalation daily.  He was instructed to rinse her mouth after use.  She is to note whether it benefits her cough and/or shortness of breath  Follow-up in 4-6 weeks with PFTs prior to that visit  Merton Border, MD PCCM service Mobile [phone] Pager [phone] 12/03/2016 4:23 PM

## 2016-12-29 ENCOUNTER — Ambulatory Visit: Payer: Federal, State, Local not specified - PPO | Attending: Pulmonary Disease

## 2016-12-29 DIAGNOSIS — Z8542 Personal history of malignant neoplasm of other parts of uterus: Secondary | ICD-10-CM | POA: Insufficient documentation

## 2016-12-29 DIAGNOSIS — Z90710 Acquired absence of both cervix and uterus: Secondary | ICD-10-CM | POA: Insufficient documentation

## 2016-12-29 DIAGNOSIS — R0602 Shortness of breath: Secondary | ICD-10-CM | POA: Diagnosis not present

## 2016-12-29 DIAGNOSIS — F329 Major depressive disorder, single episode, unspecified: Secondary | ICD-10-CM | POA: Diagnosis not present

## 2016-12-29 DIAGNOSIS — Z79899 Other long term (current) drug therapy: Secondary | ICD-10-CM | POA: Diagnosis not present

## 2016-12-29 DIAGNOSIS — E785 Hyperlipidemia, unspecified: Secondary | ICD-10-CM | POA: Insufficient documentation

## 2016-12-29 DIAGNOSIS — F17200 Nicotine dependence, unspecified, uncomplicated: Secondary | ICD-10-CM

## 2016-12-29 DIAGNOSIS — I10 Essential (primary) hypertension: Secondary | ICD-10-CM | POA: Diagnosis not present

## 2016-12-29 DIAGNOSIS — J410 Simple chronic bronchitis: Secondary | ICD-10-CM

## 2016-12-29 DIAGNOSIS — R918 Other nonspecific abnormal finding of lung field: Secondary | ICD-10-CM | POA: Insufficient documentation

## 2017-01-04 ENCOUNTER — Ambulatory Visit: Payer: Federal, State, Local not specified - PPO | Admitting: Pulmonary Disease

## 2017-01-08 ENCOUNTER — Ambulatory Visit: Payer: Federal, State, Local not specified - PPO | Admitting: Pulmonary Disease

## 2017-01-28 ENCOUNTER — Ambulatory Visit: Payer: Federal, State, Local not specified - PPO | Admitting: Pulmonary Disease

## 2017-01-28 ENCOUNTER — Encounter: Payer: Self-pay | Admitting: Pulmonary Disease

## 2017-01-28 VITALS — BP 126/82 | HR 83 | Ht 61.0 in

## 2017-01-28 DIAGNOSIS — J449 Chronic obstructive pulmonary disease, unspecified: Secondary | ICD-10-CM

## 2017-01-28 DIAGNOSIS — F17200 Nicotine dependence, unspecified, uncomplicated: Secondary | ICD-10-CM | POA: Diagnosis not present

## 2017-01-28 MED ORDER — BUPROPION HCL ER (SR) 100 MG PO TB12: 100.0000 mg | ORAL_TABLET | Freq: Two times a day (BID) | ORAL | 2 refills | Status: AC

## 2017-01-28 NOTE — Patient Instructions (Signed)
Continue your efforts at smoking cessation as we discussed Continue Wellbutrin until you are completely off all cigarettes If you remain off of cigarettes for 4-6 weeks, you may decrease Wellbutrin to once a day (in the morning) for a couple of weeks, then stop altogether  Follow-up with me as needed for any breathing, pulmonary, smoking related issues

## 2017-01-28 NOTE — Progress Notes (Signed)
PULMONARY OFFICE FOLLOW-UP NOTE  Requesting MD/Service: Doy Hutching Date of initial consultation: 12/02/16 Reason for consultation: Abnormal CT scan chest  PT PROFILE: 62 y.o. female smoker referred for evaluation of shortness of breath and abnormality noted on CT chest  DATA: 11/19/16 CT chest: Emphysema.  Bronchial wall thickening, greatest in the RLL with mild mucous plugging 12/29/16 PFTs: Mild obstruction (FEV1 2.18 L, 91%), normal lung volumes, normal DLCO  SUBJ:  She returns today to review results of pulmonary function test.  She continues to smoke approximately 5 cigarettes/day.  She is working hard on quitting completely.  She is on Wellbutrin which she believes has been beneficial with regard to her smoking cessation efforts.  She used the [employer] inhaler sample for 2 weeks and felt that it was beneficial.  However she is now off of it and believes that her shortness of breath of breath is improved due to smoking cessation.  She has no new complaints  Vitals:   01/28/17 0911 01/28/17 0914  BP:  126/82  Pulse:  83  SpO2:  98%  Height: 5\' 1"  (1.549 m)    Room air  EXAM:  Gen: NAD HEENT: NCAT, sclera white Neck: No JVD Lungs: breath sounds full, no wheezes or other adventitious sounds Cardiovascular: RRR, no murmurs Abdomen: Soft, nontender, normal BS Ext: without clubbing, cyanosis, edema Neuro: grossly intact Skin: Limited exam, no lesions noted   DATA:   BMP Latest Ref Rng & Units 11/23/2012  Glucose 65 - 99 mg/dL 101(H)  BUN 7 - 18 mg/dL 11  Creatinine 0.60 - 1.30 mg/dL 0.80  Sodium 136 - 145 mmol/L 133(L)  Potassium 3.5 - 5.1 mmol/L 3.5  Chloride 98 - 107 mmol/L 101  CO2 21 - 32 mmol/L 26  Calcium 8.5 - 10.1 mg/dL 9.4    CBC Latest Ref Rng & Units 11/23/2012  WBC 3.6 - 11.0 x10 3/mm 3 6.2  Hemoglobin 12.0 - 16.0 g/dL 14.8  Hematocrit 35.0 - 47.0 % 42.8  Platelets 150 - 440 x10 3/mm 3 280    CXR: NNF  IMPRESSION:     ICD-10-CM   1. Smoker F17.200   2.  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  I again emphasized the importance of total abstinence to minimize the progression of COPD.  We also discussed the other health benefits of smoking cessation.  She wishes to continue the Wellbutrin as previously prescribed.  I instructed her that, once off of all cigarettes, she may decrease Wellbutrin once a day for 2 weeks before stopping it altogether.  We discussed continuing therapy with bronchodilators.  She wishes to avoid all inhalers at this time as her shortness of breath is extremely mild.  She is to follow-up as needed for any respiratory, pulmonary or smoking related issues.  Merton Border, MD PCCM service Mobile [phone] Pager [phone] 01/28/2017 9:39 AM

## 2017-08-17 ENCOUNTER — Other Ambulatory Visit: Payer: Self-pay | Admitting: Pulmonary Disease

## 2017-10-26 ENCOUNTER — Other Ambulatory Visit: Payer: Self-pay | Admitting: Internal Medicine

## 2017-10-26 DIAGNOSIS — Z1231 Encounter for screening mammogram for malignant neoplasm of breast: Secondary | ICD-10-CM

## 2017-11-11 ENCOUNTER — Ambulatory Visit
Admission: RE | Admit: 2017-11-11 | Discharge: 2017-11-11 | Disposition: A | Discharge status: Home / Self Care | Payer: Federal, State, Local not specified - PPO | Source: Ambulatory Visit | Attending: Internal Medicine | Admitting: Internal Medicine

## 2017-11-11 DIAGNOSIS — Z1231 Encounter for screening mammogram for malignant neoplasm of breast: Secondary | ICD-10-CM | POA: Diagnosis not present

## 2019-03-09 ENCOUNTER — Ambulatory Visit: Payer: Federal, State, Local not specified - PPO | Attending: Internal Medicine

## 2019-03-09 ENCOUNTER — Other Ambulatory Visit: Payer: Self-pay

## 2019-03-09 DIAGNOSIS — Z23 Encounter for immunization: Secondary | ICD-10-CM | POA: Insufficient documentation

## 2019-03-09 NOTE — Progress Notes (Signed)
   Covid-19 Vaccination Clinic  Name:  ASPIN TIMLIN    MRN: [id number] DOB: February 09, 1955  03/09/2019  Ms. Aston was observed post Covid-19 immunization for 30 minutes based on pre-vaccination screening without incidence. She was provided with Vaccine Information Sheet and instruction to access the V-Safe system.   Ms. Defazio was instructed to call 911 with any severe reactions post vaccine: Marland Kitchen Difficulty breathing  . Swelling of your face and throat  . A fast heartbeat  . A bad rash all over your body  . Dizziness and weakness    Immunizations Administered    Name Date Dose VIS Date Route   Pfizer COVID-19 Vaccine 03/09/2019  9:54 AM 0.3 mL 01/06/2019 Intramuscular   Manufacturer: [place]   Lot: [id number]   [place]: [id number]

## 2019-04-05 ENCOUNTER — Ambulatory Visit: Payer: Federal, State, Local not specified - PPO | Attending: Internal Medicine

## 2019-04-05 DIAGNOSIS — Z23 Encounter for immunization: Secondary | ICD-10-CM | POA: Insufficient documentation

## 2019-04-05 NOTE — Progress Notes (Signed)
   Covid-19 Vaccination Clinic  Name:  VANNYA BICKNELL    MRN: [id number] DOB: 1955/07/28  04/05/2019  Ms. Warneke was observed post Covid-19 immunization for 15 minutes without incident. She was provided with Vaccine Information Sheet and instruction to access the V-Safe system.   Ms. Dollens was instructed to call 911 with any severe reactions post vaccine: Marland Kitchen Difficulty breathing  . Swelling of face and throat  . A fast heartbeat  . A bad rash all over body  . Dizziness and weakness   Immunizations Administered    Name Date Dose VIS Date Route   Pfizer COVID-19 Vaccine 04/05/2019  9:47 AM 0.3 mL 01/06/2019 Intramuscular   Manufacturer: [place]   Lot: [id number]   [place]: [id number]

## 2019-05-23 ENCOUNTER — Other Ambulatory Visit: Payer: Self-pay | Admitting: Internal Medicine

## 2019-05-23 DIAGNOSIS — Z1231 Encounter for screening mammogram for malignant neoplasm of breast: Secondary | ICD-10-CM

## 2019-06-01 ENCOUNTER — Ambulatory Visit
Admission: RE | Admit: 2019-06-01 | Discharge: 2019-06-01 | Disposition: A | Discharge status: Home / Self Care | Payer: Federal, State, Local not specified - PPO | Source: Ambulatory Visit | Attending: Internal Medicine | Admitting: Internal Medicine

## 2019-06-01 DIAGNOSIS — Z1231 Encounter for screening mammogram for malignant neoplasm of breast: Secondary | ICD-10-CM | POA: Diagnosis present

## 2020-01-01 ENCOUNTER — Ambulatory Visit: Payer: Federal, State, Local not specified - PPO | Attending: Internal Medicine

## 2020-01-01 DIAGNOSIS — Z23 Encounter for immunization: Secondary | ICD-10-CM

## 2020-01-01 NOTE — Progress Notes (Signed)
   Covid-19 Vaccination Clinic  Name:  Yolanda Bailey    MRN: [id number] DOB: 12/19/1955  01/01/2020  [name] was observed post Covid-19 immunization for 15 minutes without incident. She was provided with Vaccine Information Sheet and instruction to access the V-Safe system.   [name] was instructed to call 911 with any severe reactions post vaccine: Marland Kitchen Difficulty breathing  . Swelling of face and throat  . A fast heartbeat  . A bad rash all over body  . Dizziness and weakness   Immunizations Administered    Name Date Dose VIS Date Route   Pfizer COVID-19 Vaccine 01/01/2020  2:19 PM 0.3 mL 11/15/2019 Intramuscular   Manufacturer: [place]   Lot: [id number]   ND[facility]: [address]-3

## 2020-05-29 ENCOUNTER — Other Ambulatory Visit: Payer: Self-pay | Admitting: Internal Medicine

## 2020-05-29 DIAGNOSIS — Z1231 Encounter for screening mammogram for malignant neoplasm of breast: Secondary | ICD-10-CM

## 2021-05-14 ENCOUNTER — Ambulatory Visit
Admission: RE | Admit: 2021-05-14 | Discharge: 2021-05-14 | Disposition: A | Discharge status: Home / Self Care | Payer: Federal, State, Local not specified - PPO | Source: Ambulatory Visit | Attending: Internal Medicine | Admitting: Internal Medicine

## 2021-05-14 DIAGNOSIS — Z1231 Encounter for screening mammogram for malignant neoplasm of breast: Secondary | ICD-10-CM | POA: Insufficient documentation

## 2022-06-16 ENCOUNTER — Other Ambulatory Visit: Payer: Self-pay | Admitting: Internal Medicine

## 2022-06-16 DIAGNOSIS — Z1231 Encounter for screening mammogram for malignant neoplasm of breast: Secondary | ICD-10-CM

## 2022-07-24 ENCOUNTER — Ambulatory Visit
Admission: RE | Admit: 2022-07-24 | Discharge: 2022-07-24 | Disposition: A | Discharge status: Home / Self Care | Payer: Federal, State, Local not specified - PPO | Source: Ambulatory Visit | Attending: Internal Medicine | Admitting: Internal Medicine

## 2022-07-24 DIAGNOSIS — Z1231 Encounter for screening mammogram for malignant neoplasm of breast: Secondary | ICD-10-CM | POA: Insufficient documentation

## 2023-06-25 ENCOUNTER — Other Ambulatory Visit: Payer: Self-pay | Admitting: Internal Medicine

## 2023-06-25 DIAGNOSIS — Z1231 Encounter for screening mammogram for malignant neoplasm of breast: Secondary | ICD-10-CM

## 2023-10-14 ENCOUNTER — Ambulatory Visit
Admission: RE | Admit: 2023-10-14 | Discharge: 2023-10-14 | Disposition: A | Discharge status: Home / Self Care | Source: Ambulatory Visit | Attending: Internal Medicine | Admitting: Internal Medicine

## 2023-10-14 DIAGNOSIS — Z1231 Encounter for screening mammogram for malignant neoplasm of breast: Secondary | ICD-10-CM | POA: Diagnosis present
